# Patient Record
Sex: Male | Born: 1990 | Race: White | Hispanic: No
Health system: Southern US, Community
[De-identification: ages and names within clinical notes are randomized; demographics above are authoritative.]

---

## 2018-01-12 ENCOUNTER — Emergency Department
Admission: EM | Admit: 2018-01-12 | Discharge: 2018-01-12 | Disposition: A | Discharge status: Home / Self Care | Payer: Self-pay | Attending: Student in an Organized Health Care Education/Training Program | Admitting: Student in an Organized Health Care Education/Training Program

## 2018-01-12 ENCOUNTER — Emergency Department: Payer: Self-pay

## 2018-01-12 ENCOUNTER — Encounter: Payer: Self-pay | Admitting: Medical Oncology

## 2018-01-12 DIAGNOSIS — X500XXA Overexertion from strenuous movement or load, initial encounter: Secondary | ICD-10-CM | POA: Insufficient documentation

## 2018-01-12 DIAGNOSIS — S838X2A Sprain of other specified parts of left knee, initial encounter: Secondary | ICD-10-CM | POA: Insufficient documentation

## 2018-01-12 DIAGNOSIS — Y929 Unspecified place or not applicable: Secondary | ICD-10-CM | POA: Insufficient documentation

## 2018-01-12 DIAGNOSIS — L723 Sebaceous cyst: Secondary | ICD-10-CM

## 2018-01-12 DIAGNOSIS — Y9389 Activity, other specified: Secondary | ICD-10-CM | POA: Insufficient documentation

## 2018-01-12 DIAGNOSIS — Y999 Unspecified external cause status: Secondary | ICD-10-CM | POA: Insufficient documentation

## 2018-01-12 MED ORDER — NAPROXEN 500 MG PO TABS: 500.0000 mg | ORAL_TABLET | Freq: Two times a day (BID) | ORAL | Status: AC

## 2018-01-12 NOTE — Discharge Instructions (Addendum)
Wear Ace wrap for 2 to 3 days as directed.  Consider purchasing an elastic knee support to replace Ace wrap.  Take medication as directed.

## 2018-01-12 NOTE — ED Notes (Addendum)
See triage note  States he twisted left knee about 1-2 weeks ago  conts to have pain and swelling to left knee  Ambulates with slight limp d/t pain

## 2018-01-12 NOTE — ED Provider Notes (Signed)
[facility] Emergency Department Provider Note   ____________________________________________   First MD Initiated Contact with Patient 01/12/18 1603     (approximate)  I have reviewed the triage vital signs and the nursing notes.   HISTORY  Chief Complaint Knee Pain    HPI Dennis Barnes is a 27 y.o. male patient complain of left knee pain secondary to twisting incident 2 to 3 weeks ago.  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  Patient is not reported this is a work related injury.  Patient also complained about a soft spot top of his scalp for 6 months.  Patient denies any pain with this complaint.  Patient denies vertigo, vision disturbance, or weakness.  Patient rates his knee pain as 8/10.  Patient described the pain is "ache".  No palliative measure for complaint.  Patient state able to ambulate with difficulty.   History reviewed. No pertinent past medical history.  There are no active problems to display for this patient.     Prior to Admission medications   Medication Sig Start Date End Date Taking? Authorizing Provider  naproxen (NAPROSYN) 500 MG tablet Take 1 tablet (500 mg total) by mouth 2 (two) times daily with a meal. 01/12/18   Joni ReiningSmith, Izaih Kataoka K, PA-C    Allergies Patient has no known allergies.  No family history on file.  Social History Social History   Tobacco Use  . Smoking status: Not on file  Substance Use Topics  . Alcohol use: Not on file  . Drug use: Not on file    Review of Systems Constitutional: No fever/chills Eyes: No visual changes. ENT: No sore throat. Cardiovascular: Denies chest pain. Respiratory: Denies shortness of breath. Gastrointestinal: No abdominal pain.  No nausea, no vomiting.  No diarrhea.  No constipation. Genitourinary: Negative for dysuria. Musculoskeletal: Left knee pain. Skin: Negative for rash. Neurological: Negative for headaches, focal weakness or  numbness.   ____________________________________________   PHYSICAL EXAM:  VITAL SIGNS: ED Triage Vitals [01/12/18 1601]  Enc Vitals Group     BP 116/73     Pulse Rate 85     Resp 16     Temp 98.2 F (36.8 C)     Temp Source Oral     SpO2 97 %     Weight 190 lb (86.2 kg)     Height 5\' 11"  (1.803 m)     Head Circumference      Peak Flow      Pain Score 8     Pain Loc      Pain Edu?      Excl. in GC?    Constitutional: Alert and oriented. Well appearing and in no acute distress.  No cervical lymphadenopathy. Cardiovascular: Normal rate, regular rhythm. Grossly normal heart sounds.  Good peripheral circulation. Respiratory: Normal respiratory effort.  No retractions. Lungs CTAB. Musculoskeletal: No lower extremity tenderness nor edema.  No joint effusions. Neurologic:  Normal speech and language. No gross focal neurologic deficits are appreciated. No gait instability. Skin: Nonerythematous nodule lesion superior aspect of scalp.  Psychiatric: Mood and affect are normal. Speech and behavior are normal.  ____________________________________________   LABS (all labs ordered are listed, but only abnormal results are displayed)  Labs Reviewed - No data to display ____________________________________________  EKG   ____________________________________________  RADIOLOGY  No acute findings on x-ray of left knee.  Official radiology report(s): Dg Knee Complete 4 Views Left  Result Date: 01/12/2018 CLINICAL DATA:  Left knee pain since a  twisting injury two weeks ago. Initial encounter. EXAM: LEFT KNEE - COMPLETE 4+ VIEW COMPARISON:  None. FINDINGS: No evidence of fracture, dislocation, or joint effusion. No evidence of arthropathy or other focal bone abnormality. Soft tissues are unremarkable. IMPRESSION: Negative exam. Electronically Signed   By: Drusilla Kanner M.D.   On: 01/12/2018 17:12    ____________________________________________   PROCEDURES  Procedure(s)  performed: None  Procedures  Critical Care performed: No  ____________________________________________   INITIAL IMPRESSION / ASSESSMENT AND PLAN / ED COURSE  As part of my medical decision making, I reviewed the following data within the electronic MEDICAL RECORD NUMBER    Patient presents with 2 to 3 weeks of left knee pain secondary to a twisting incident.  Patient also request evaluation of nausea lesions for aspect of scalp.  Discussed negative x-ray findings with patient left knee.  Patient scalp lesion consistent with sebaceous cyst.  Patient given discharge care instructions.  Patient placed in Ace wrap of the left knee.  Patient advised to follow orthopedic if no improvement in 3 to 5 days of his left knee.  [facility].      ____________________________________________   FINAL CLINICAL IMPRESSION(S) / ED DIAGNOSES  Final diagnoses:  Sprain of other ligament of left knee, initial encounter  Sebaceous cyst     ED Discharge Orders        Ordered    naproxen (NAPROSYN) 500 MG tablet  2 times daily with meals     01/12/18 1728       Note:  This document was prepared using Dragon voice recognition software and may include unintentional dictation errors.    Joni Reining, PA-C 01/12/18 1732    Willy Eddy, MD 01/12/18 2011

## 2018-01-12 NOTE — ED Triage Notes (Signed)
Pt reports left knee pain x 2-3 weeks. Pt able to ambulate.

## 2018-12-24 IMAGING — CR DG KNEE COMPLETE 4+V*L*
4 series · 4 of 4 positions shown · non-contrast
Comparison: None.

CLINICAL DATA: Left knee pain since a twisting injury two weeks
ago. Initial encounter.

EXAM:
LEFT KNEE - COMPLETE 4+ VIEW

[knee ap]
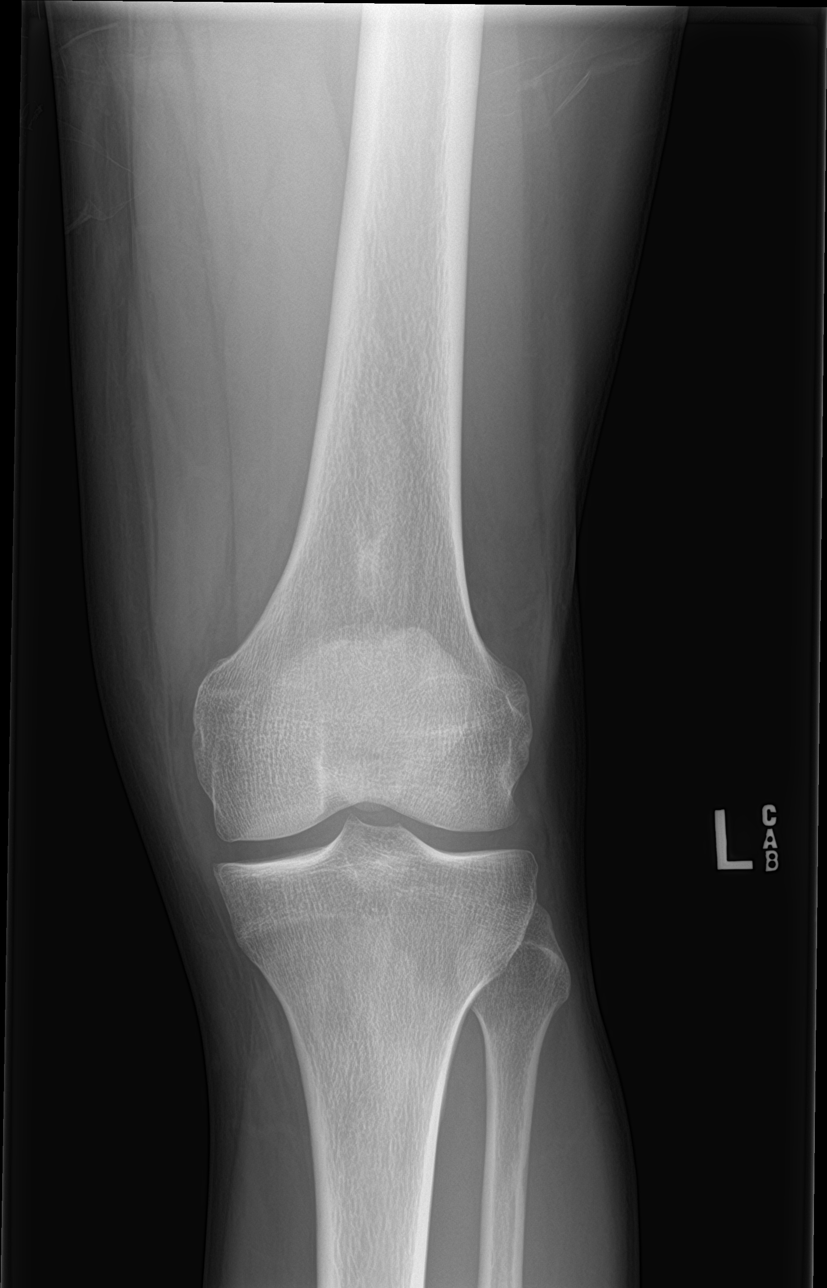

[knee obl (1 of 2)]
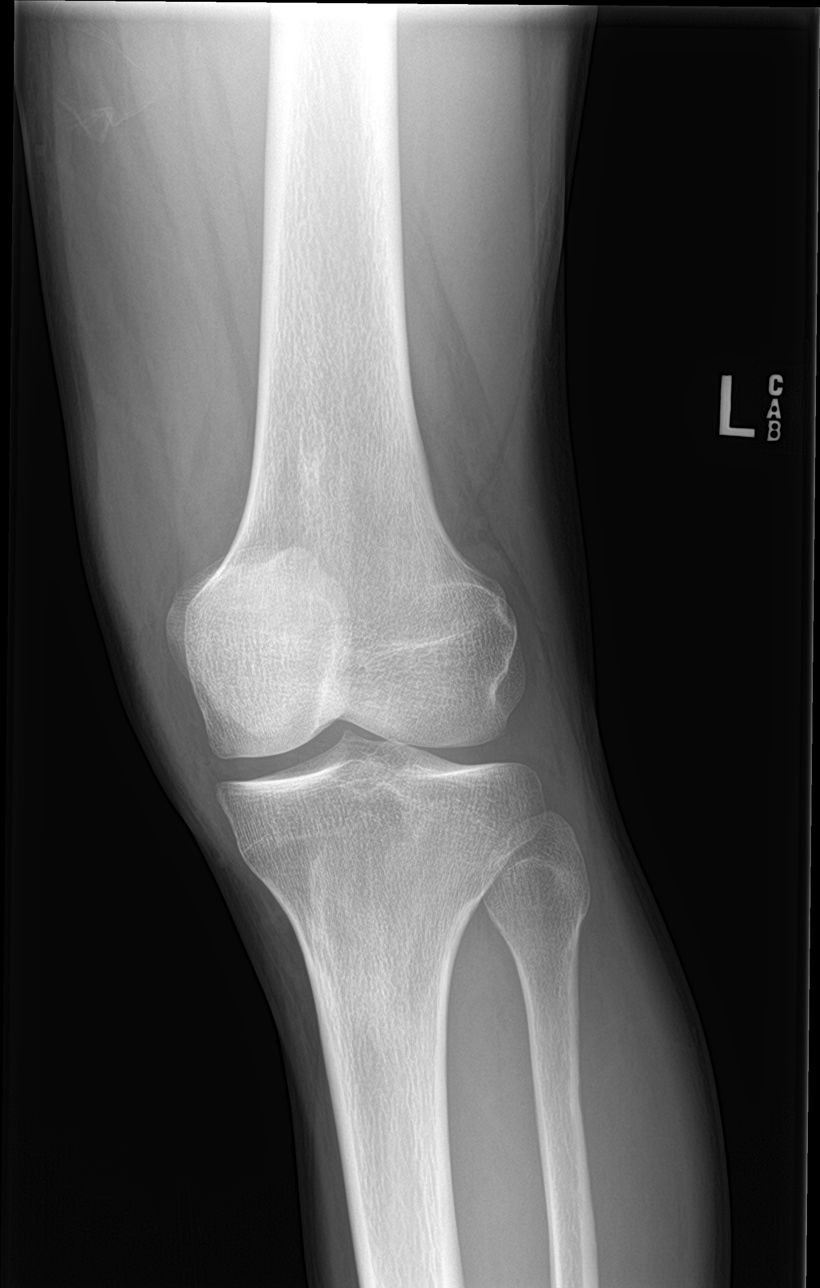

[knee obl (2 of 2)]
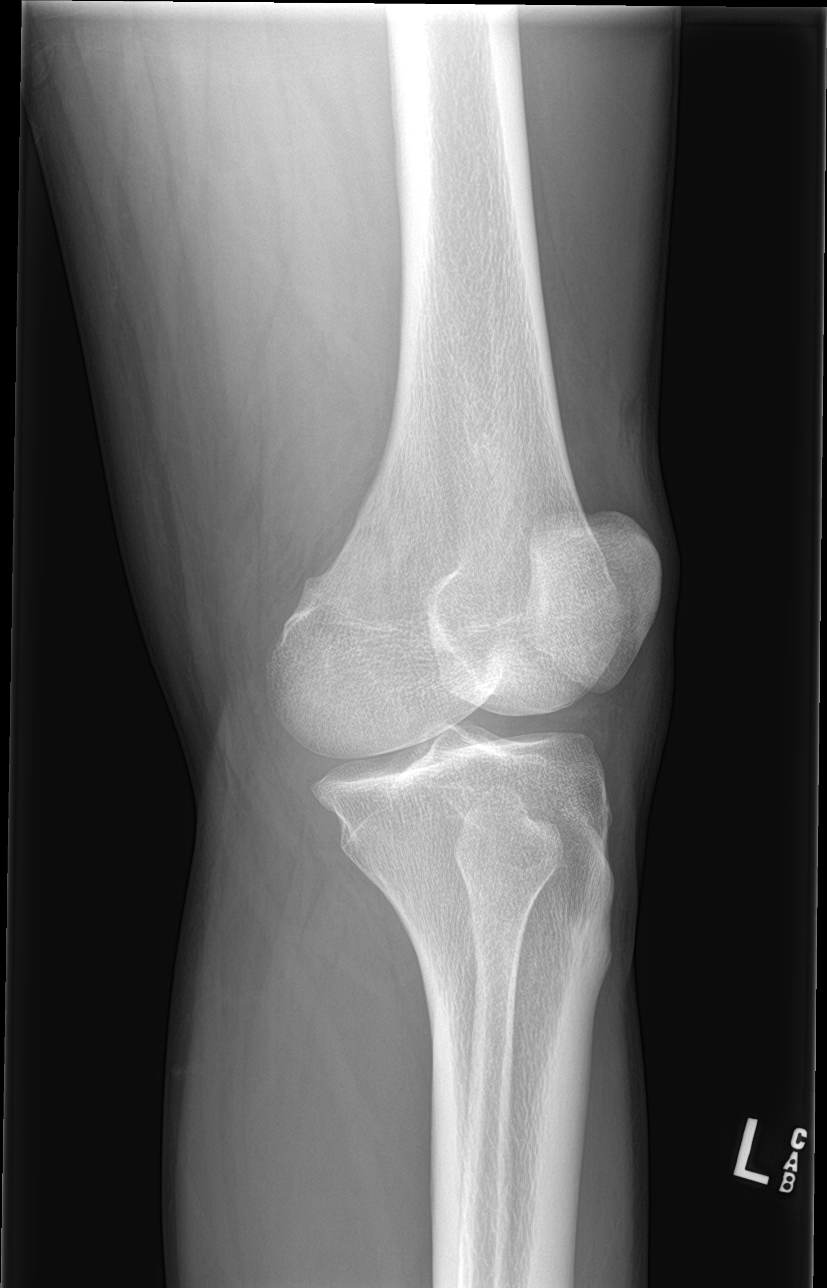

[knee lat]
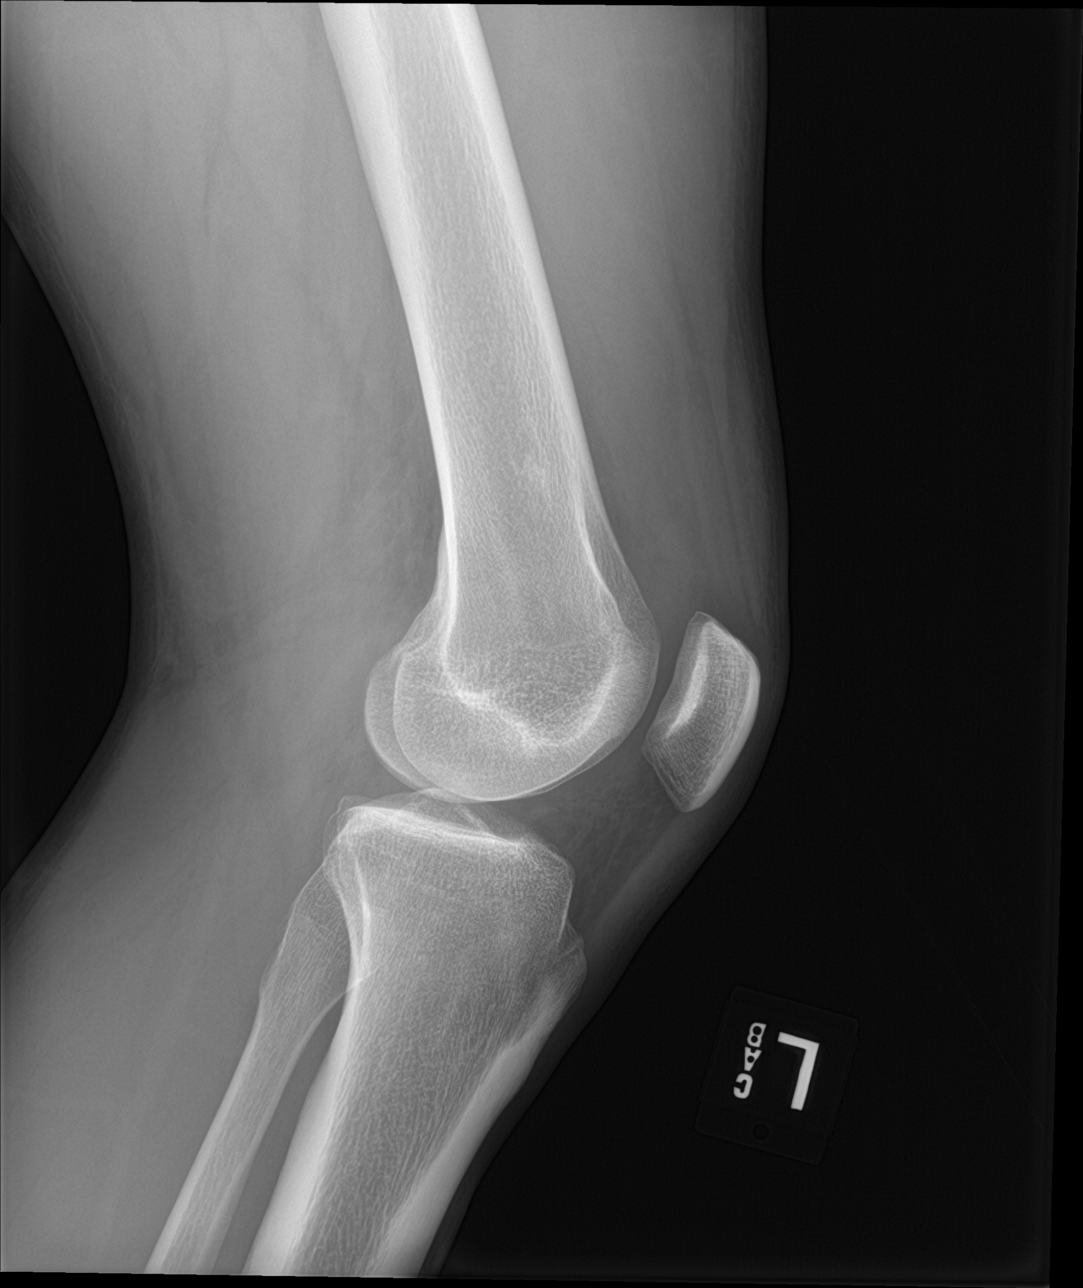

[4 of 4 positions shown; findings below may reference images not displayed]

FINDINGS: No evidence of fracture, dislocation, or joint effusion. No evidence
of arthropathy or other focal bone abnormality. Soft tissues are
unremarkable.
IMPRESSION: Negative exam.

## 2021-05-17 ENCOUNTER — Inpatient Hospital Stay
Admit: 2021-05-17 | Discharge: 2021-05-17 | Disposition: A | Discharge status: Home / Self Care | Attending: Emergency Medicine

## 2021-05-17 DIAGNOSIS — J029 Acute pharyngitis, unspecified: Secondary | ICD-10-CM

## 2021-05-17 LAB — STREP SCREEN GROUP A THROAT: Rapid Strep A Screen: NEGATIVE

## 2021-05-17 LAB — COVID-19, RAPID: SARS-CoV-2, NAAT: NOT DETECTED

## 2021-05-17 LAB — RAPID INFLUENZA A/B ANTIGENS
Rapid Influenza A Ag: NEGATIVE
Rapid Influenza B Ag: NEGATIVE

## 2021-05-17 MED ORDER — AZITHROMYCIN 500 MG PO TABS
500 MG | PACK | Freq: Every day | ORAL | 0 refills | Status: AC
Start: 2021-05-17 — End: 2021-05-20

## 2021-05-17 NOTE — ED Notes (Signed)
Discharge instructions and medications reviewed with patient, verbalizes understanding. Patient discharged home via POV.        Philbert Riser, RN  05/17/21 1353

## 2021-05-17 NOTE — ED Provider Notes (Signed)
[facility] Kitchen       [facility] EMERGENCY DEPARTMENT  EMERGENCY DEPARTMENT ENCOUNTER      Pt Name: Samuel Duffy  MRN: [id number]  Birthdate: 08/21/1990  Date of evaluation: 05/17/2021  Provider: Ancil Linsey, MD    CHIEF COMPLAINT       Chief Complaint   Patient presents with    Pharyngitis         HISTORY OF PRESENT ILLNESS  (Location/Symptom, Timing/Onset, Context/Setting, Quality, Duration, Modifying Factors, Severity.)   Samuel Duffy is a 30 y.o. male who presents to the emergency department complaining that he is visiting from [place] he was diagnosed there a week ago with strep throat he took 2 days of the amoxicillin he was prescribed and then he came here and forgot to bring his antibiotic with him he continues to have a sore throat he denies any fever head congestion or cough.      Nursing notes were reviewed.    REVIEW OFSYSTEMS    (2-9 systems for level 4, 10 or more for level 5)   ROS:  General:  No fevers, no chills, no weakness  Cardiovascular:  No chest pain, no palpitations  Respiratory:  No shortness of breath, no cough, no wheezing  Gastrointestinal:  No pain, no nausea, no vomiting, no diarrhea  Musculoskeletal:  No muscle pain, no joint pain  Skin:  No rash, no easy bruising  Neurologic:  No speech problems, no headache, no extremity weakness  Psychiatric:  No anxiety  Genitourinary:  No dysuria, no hematuria    Except as noted above the remainder of the review of systems was reviewed and negative.       PAST MEDICAL HISTORY   History reviewed. No pertinent past medical history.      SURGICAL HISTORY     History reviewed. No pertinent surgical history.      CURRENT MEDICATIONS       Previous Medications    No medications on file       ALLERGIES     Codeine    FAMILY HISTORY     History reviewed. No pertinent family history.       SOCIAL HISTORY       Social History     Tobacco Use    Smoking status: Some Days     Types: Cigarettes    Smokeless tobacco: Former   Substance and Sexual Activity    Alcohol  use: Never    Drug use: Yes     Types: Marijuana (Weed)         PHYSICAL EXAM    (up to 7 for level 4, 8 or more for level 5)     ED Triage Vitals [05/17/21 1202]   BP Temp Temp Source Heart Rate Resp SpO2 Height Weight   (!) 127/96 98.1 ??F (36.7 ??C) Oral 95 16 96 % 5\' 11"  (1.803 m) 194 lb (88 kg)       Physical Exam  General :Patient is awake, alert, oriented, in no acute distress, nontoxic appearing  HEENT: Pupils are equally round and reactive to light, EOMI, conjunctivae clear.  Oral mucosa is moist, no exudate. Uvula is midline injected oropharynx  Neck: Neck is supple, full range of motion, trachea midline slightly swollen submandibular glands  Cardiac: Heart regular rate, rhythm, no murmurs, rubs, or gallops  Lungs: Lungs are clear to auscultation, there is no wheezing, rhonchi, or rales. There is no use of accessory muscles.  Chest wall: There is no  tenderness to palpation over the chest wall or over ribs    Musculoskeletal: 5 out of 5 strength in all 4 extremities.  No focal muscle deficits are appreciated  Neuro: Motor intact, sensory intact, level of consciousness is normal,Dermatology: Skin is warm and dry  Psych: Mentation is grossly normal, cognition is grossly normal. Affect is appropriate.      DIAGNOSTIC RESULTS     EKG: All EKG's are interpreted by the Emergency Department Physician who either signs or Co-signs this chart in the absenceof a cardiologist.    The EKG interpreted by me shows     RADIOLOGY:   Non-plain film images such as CT, Ultrasound and MRI are read by the radiologist. Plain radiographic images are visualized and preliminarily interpreted by the emergency physician with the below findings:      []  Radiologist's Report Reviewed:  No orders to display         ED BEDSIDE ULTRASOUND:   Performed by ED Physician - none    LABS:    I have reviewed and interpreted all of the currently available lab results from this visit (ifapplicable):  Results for orders placed or performed during the  hospital encounter of 05/17/21   COVID-19, Rapid    Specimen: Nasopharyngeal Swab   Result Value Ref Range    SARS-CoV-2, NAAT Not Detected Not Detected   Strep Screen Group A Throat    Specimen: Throat   Result Value Ref Range    Rapid Strep A Screen Negative Negative   Rapid Influenza A/B Antigens    Specimen: Nasopharyngeal   Result Value Ref Range    Rapid Influenza A Ag Negative Negative    Rapid Influenza B Ag Negative Negative        All other labs were within normal range or not returned as of this dictation.    EMERGENCY DEPARTMENT COURSE and DIFFERENTIAL DIAGNOSIS/MDM:   Vitals:    Vitals:    05/17/21 1202   BP: (!) 127/96   Pulse: 95   Resp: 16   Temp: 98.1 ??F (36.7 ??C)   TempSrc: Oral   SpO2: 96%   Weight: 194 lb (88 kg)   Height: 5\' 11"  (1.803 m)       MEDICATIONS ADMINISTERED IN ED:  Medications - No data to display    Since COVID flu and strep are all negative since he was partially treated for a reported strep infection prior to coming here I will go ahead and give him a Z-Pak to finish up that treatment.  He is going to be here for couple weeks I have instructed him to follow-up with an urgent care or a physician in the area in the next day or 2 if he is not better.      The patient will follow-up with their PCP in 1-2 days for reevaluation.  If the patient or family members have anyfurther concerns or any worsening symptoms they will return to the ED for reevaluation.    Is this patient to be included in the SEP-1 Core Measure due to severe sepsis or septic shock?   No   Exclusion criteria - the patient is NOT to be included for SEP-1 Core Measure due to:  May have criteria for sepsis, but does not meet criteria for severe sepsis or septic shock          CONSULTS:  None    PROCEDURES:  Procedures    CRITICAL CARE TIME    Total Critical Care  time was 0 minutes, excluding separately reportable procedures.   There was a high probability of clinically significant/life threatening deterioration in the  patient's condition which required my urgent intervention.      FINAL IMPRESSION      1. Pharyngitis, unspecified etiology New Problem         DISPOSITION/PLAN   DISPOSITION Decision To Discharge 05/17/2021 01:16:21 PM  Stable discharge to home    PATIENT REFERRED TO:  Primary care    Schedule an appointment as soon as possible for a visit in 2 days      DISCHARGE MEDICATIONS:  New Prescriptions    AZITHROMYCIN (ZITHROMAX) 500 MG TABLET    Take 1 tablet by mouth daily for 3 days       Comment: Please note this report has been produced using speech recognition software and may contain errorsrelated to that system including errors in grammar, punctuation, and spelling, as well as words and phrases that may be inappropriate. If there are any questions or concerns please feel free to contact the dictating providerfor clarification.    Ancil Linsey, MD  Attending Emergency Physician               Ancil Linsey, MD  05/17/21 1318

## 2021-05-17 NOTE — ED Notes (Signed)
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 Patient states that he would much rather have a z pack instead of the amoxicillen for his strep throat.     Nicanor Alcon, RN  05/17/21 1212
